# Patient Record
Sex: Male | Born: 1990 | Race: Black or African American | Hispanic: No | Marital: Single | Smoking: Current every day smoker
Health system: Southern US, Community
[De-identification: ages and names within clinical notes are randomized; demographics above are authoritative.]

## PROBLEM LIST (undated history)

## (undated) DIAGNOSIS — J45909 Unspecified asthma, uncomplicated: Secondary | ICD-10-CM

---

## 2014-06-22 ENCOUNTER — Encounter (HOSPITAL_COMMUNITY): Payer: Self-pay | Admitting: *Deleted

## 2014-06-22 ENCOUNTER — Emergency Department (HOSPITAL_COMMUNITY)
Admission: EM | Admit: 2014-06-22 | Discharge: 2014-06-22 | Disposition: A | Discharge status: Home / Self Care | Payer: Self-pay | Attending: Emergency Medicine | Admitting: Emergency Medicine

## 2014-06-22 ENCOUNTER — Emergency Department (HOSPITAL_COMMUNITY): Payer: Self-pay

## 2014-06-22 DIAGNOSIS — Z720 Tobacco use: Secondary | ICD-10-CM | POA: Insufficient documentation

## 2014-06-22 DIAGNOSIS — M542 Cervicalgia: Secondary | ICD-10-CM

## 2014-06-22 DIAGNOSIS — M549 Dorsalgia, unspecified: Secondary | ICD-10-CM

## 2014-06-22 DIAGNOSIS — S3992XD Unspecified injury of lower back, subsequent encounter: Secondary | ICD-10-CM | POA: Insufficient documentation

## 2014-06-22 DIAGNOSIS — S199XXD Unspecified injury of neck, subsequent encounter: Secondary | ICD-10-CM | POA: Insufficient documentation

## 2014-06-22 DIAGNOSIS — J45909 Unspecified asthma, uncomplicated: Secondary | ICD-10-CM | POA: Insufficient documentation

## 2014-06-22 HISTORY — DX: Unspecified asthma, uncomplicated: J45.909

## 2014-06-22 MED ORDER — METHOCARBAMOL 750 MG PO TABS: 750.0000 mg | ORAL_TABLET | Freq: Four times a day (QID) | ORAL | Status: AC | PRN
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
## 2014-06-22 NOTE — ED Notes (Signed)
Pt reports he wants to try another type of pain pill. Pt reports he has been taking naproxen with out relief. Pt was treated in [place] . Pt reports back and neck pain for 3 weeks.

## 2014-06-22 NOTE — Discharge Instructions (Signed)
Read the information below.  Use the prescribed medication as directed.  Please discuss all new medications with your pharmacist.  You may return to the Emergency Department at any time for worsening condition or any new symptoms that concern you.   If you develop fevers, loss of control of bowel or bladder, weakness or numbness in your legs, or are unable to walk, return to the ER for a recheck.     Cervical Strain and Sprain (Whiplash) with Rehab Cervical strain and sprain are injuries that commonly occur with "whiplash" injuries. Whiplash occurs when the neck is forcefully whipped backward or forward, such as during a motor vehicle accident or during contact sports. The muscles, ligaments, tendons, discs, and nerves of the neck are susceptible to injury when this occurs. RISK FACTORS Risk of having a whiplash injury increases if:  Osteoarthritis of the spine.  Situations that make head or neck accidents or trauma more likely.  High-risk sports (football, rugby, wrestling, hockey, auto racing, gymnastics, diving, contact karate, or boxing).  Poor strength and flexibility of the neck.  Previous neck injury.  Poor tackling technique.  Improperly fitted or padded equipment. SYMPTOMS   Pain or stiffness in the front or back of neck or both.  Symptoms may present immediately or up to 24 hours after injury.  Dizziness, headache, nausea, and vomiting.  Muscle spasm with soreness and stiffness in the neck.  Tenderness and swelling at the injury site. PREVENTION  Learn and use proper technique (avoid tackling with the head, spearing, and head-butting; use proper falling techniques to avoid landing on the head).  Warm up and stretch properly before activity.  Maintain physical fitness:  Strength, flexibility, and endurance.  Cardiovascular fitness.  Wear properly fitted and padded protective equipment, such as padded soft collars, for participation in contact sports. PROGNOSIS    Recovery from cervical strain and sprain injuries is dependent on the extent of the injury. These injuries are usually curable in 1 week to 3 months with appropriate treatment.  RELATED COMPLICATIONS   Temporary numbness and weakness may occur if the nerve roots are damaged, and this may persist until the nerve has completely healed.  Chronic pain due to frequent recurrence of symptoms.  Prolonged healing, especially if activity is resumed too soon (before complete recovery). TREATMENT  Treatment initially involves the use of ice and medication to help reduce pain and inflammation. It is also important to perform strengthening and stretching exercises and modify activities that worsen symptoms so the injury does not get worse. These exercises may be performed at home or with a therapist. For patients who experience severe symptoms, a soft, padded collar may be recommended to be worn around the neck.  Improving your posture may help reduce symptoms. Posture improvement includes pulling your chin and abdomen in while sitting or standing. If you are sitting, sit in a firm chair with your buttocks against the back of the chair. While sleeping, try replacing your pillow with a small towel rolled to 2 inches in diameter, or use a cervical pillow or soft cervical collar. Poor sleeping positions delay healing.  For patients with nerve root damage, which causes numbness or weakness, the use of a cervical traction apparatus may be recommended. Surgery is rarely necessary for these injuries. However, cervical strain and sprains that are present at birth (congenital) may require surgery. MEDICATION   If pain medication is necessary, nonsteroidal anti-inflammatory medications, such as aspirin and ibuprofen, or other minor pain relievers, such as acetaminophen,  are often recommended.  Do not take pain medication for 7 days before surgery.  Prescription pain relievers may be given if deemed necessary by your  caregiver. Use only as directed and only as much as you need. HEAT AND COLD:   Cold treatment (icing) relieves pain and reduces inflammation. Cold treatment should be applied for 10 to 15 minutes every 2 to 3 hours for inflammation and pain and immediately after any activity that aggravates your symptoms. Use ice packs or an ice massage.  Heat treatment may be used prior to performing the stretching and strengthening activities prescribed by your caregiver, physical therapist, or athletic trainer. Use a heat pack or a warm soak. SEEK MEDICAL CARE IF:   Symptoms get worse or do not improve in 2 weeks despite treatment.  New, unexplained symptoms develop (drugs used in treatment may produce side effects). EXERCISES RANGE OF MOTION (ROM) AND STRETCHING EXERCISES - Cervical Strain and Sprain These exercises may help you when beginning to rehabilitate your injury. In order to successfully resolve your symptoms, you must improve your posture. These exercises are designed to help reduce the forward-head and rounded-shoulder posture which contributes to this condition. Your symptoms may resolve with or without further involvement from your physician, physical therapist or athletic trainer. While completing these exercises, remember:   Restoring tissue flexibility helps normal motion to return to the joints. This allows healthier, less painful movement and activity.  An effective stretch should be held for at least 20 seconds, although you may need to begin with shorter hold times for comfort.  A stretch should never be painful. You should only feel a gentle lengthening or release in the stretched tissue. STRETCH- Axial Extensors  Lie on your back on the floor. You may bend your knees for comfort. Place a rolled-up hand towel or dish towel, about 2 inches in diameter, under the part of your head that makes contact with the floor.  Gently tuck your chin, as if trying to make a "double chin," until you  feel a gentle stretch at the base of your head.  Hold __________ seconds. Repeat __________ times. Complete this exercise __________ times per day.  STRETCH - Axial Extension   Stand or sit on a firm surface. Assume a good posture: chest up, shoulders drawn back, abdominal muscles slightly tense, knees unlocked (if standing) and feet hip width apart.  Slowly retract your chin so your head slides back and your chin slightly lowers. Continue to look straight ahead.  You should feel a gentle stretch in the back of your head. Be certain not to feel an aggressive stretch since this can cause headaches later.  Hold for __________ seconds. Repeat __________ times. Complete this exercise __________ times per day. STRETCH - Cervical Side Bend   Stand or sit on a firm surface. Assume a good posture: chest up, shoulders drawn back, abdominal muscles slightly tense, knees unlocked (if standing) and feet hip width apart.  Without letting your nose or shoulders move, slowly tip your right / left ear to your shoulder until your feel a gentle stretch in the muscles on the opposite side of your neck.  Hold __________ seconds. Repeat __________ times. Complete this exercise __________ times per day. STRETCH - Cervical Rotators   Stand or sit on a firm surface. Assume a good posture: chest up, shoulders drawn back, abdominal muscles slightly tense, knees unlocked (if standing) and feet hip width apart.  Keeping your eyes level with the ground, slowly turn your  head until you feel a gentle stretch along the back and opposite side of your neck.  Hold __________ seconds. Repeat __________ times. Complete this exercise __________ times per day. RANGE OF MOTION - Neck Circles   Stand or sit on a firm surface. Assume a good posture: chest up, shoulders drawn back, abdominal muscles slightly tense, knees unlocked (if standing) and feet hip width apart.  Gently roll your head down and around from the back of  one shoulder to the back of the other. The motion should never be forced or painful.  Repeat the motion 10-20 times, or until you feel the neck muscles relax and loosen. Repeat __________ times. Complete the exercise __________ times per day. STRENGTHENING EXERCISES - Cervical Strain and Sprain These exercises may help you when beginning to rehabilitate your injury. They may resolve your symptoms with or without further involvement from your physician, physical therapist, or athletic trainer. While completing these exercises, remember:   Muscles can gain both the endurance and the strength needed for everyday activities through controlled exercises.  Complete these exercises as instructed by your physician, physical therapist, or athletic trainer. Progress the resistance and repetitions only as guided.  You may experience muscle soreness or fatigue, but the pain or discomfort you are trying to eliminate should never worsen during these exercises. If this pain does worsen, stop and make certain you are following the directions exactly. If the pain is still present after adjustments, discontinue the exercise until you can discuss the trouble with your clinician. STRENGTH - Cervical Flexors, Isometric  Face a wall, standing about 6 inches away. Place a small pillow, a ball about 6-8 inches in diameter, or a folded towel between your forehead and the wall.  Slightly tuck your chin and gently push your forehead into the soft object. Push only with mild to moderate intensity, building up tension gradually. Keep your jaw and forehead relaxed.  Hold 10 to 20 seconds. Keep your breathing relaxed.  Release the tension slowly. Relax your neck muscles completely before you start the next repetition. Repeat __________ times. Complete this exercise __________ times per day. STRENGTH- Cervical Lateral Flexors, Isometric   Stand about 6 inches away from a wall. Place a small pillow, a ball about 6-8 inches  in diameter, or a folded towel between the side of your head and the wall.  Slightly tuck your chin and gently tilt your head into the soft object. Push only with mild to moderate intensity, building up tension gradually. Keep your jaw and forehead relaxed.  Hold 10 to 20 seconds. Keep your breathing relaxed.  Release the tension slowly. Relax your neck muscles completely before you start the next repetition. Repeat __________ times. Complete this exercise __________ times per day. STRENGTH - Cervical Extensors, Isometric   Stand about 6 inches away from a wall. Place a small pillow, a ball about 6-8 inches in diameter, or a folded towel between the back of your head and the wall.  Slightly tuck your chin and gently tilt your head back into the soft object. Push only with mild to moderate intensity, building up tension gradually. Keep your jaw and forehead relaxed.  Hold 10 to 20 seconds. Keep your breathing relaxed.  Release the tension slowly. Relax your neck muscles completely before you start the next repetition. Repeat __________ times. Complete this exercise __________ times per day. POSTURE AND BODY MECHANICS CONSIDERATIONS - Cervical Strain and Sprain Keeping correct posture when sitting, standing or completing your activities will reduce  the stress put on different body tissues, allowing injured tissues a chance to heal and limiting painful experiences. The following are general guidelines for improved posture. Your physician or physical therapist will provide you with any instructions specific to your needs. While reading these guidelines, remember:  The exercises prescribed by your provider will help you have the flexibility and strength to maintain correct postures.  The correct posture provides the optimal environment for your joints to work. All of your joints have less wear and tear when properly supported by a spine with good posture. This means you will experience a healthier,  less painful body.  Correct posture must be practiced with all of your activities, especially prolonged sitting and standing. Correct posture is as important when doing repetitive low-stress activities (typing) as it is when doing a single heavy-load activity (lifting). PROLONGED STANDING WHILE SLIGHTLY LEANING FORWARD When completing a task that requires you to lean forward while standing in one place for a long time, place either foot up on a stationary 2- to 4-inch high object to help maintain the best posture. When both feet are on the ground, the low back tends to lose its slight inward curve. If this curve flattens (or becomes too large), then the back and your other joints will experience too much stress, fatigue more quickly, and can cause pain.  RESTING POSITIONS Consider which positions are most painful for you when choosing a resting position. If you have pain with flexion-based activities (sitting, bending, stooping, squatting), choose a position that allows you to rest in a less flexed posture. You would want to avoid curling into a fetal position on your side. If your pain worsens with extension-based activities (prolonged standing, working overhead), avoid resting in an extended position such as sleeping on your stomach. Most people will find more comfort when they rest with their spine in a more neutral position, neither too rounded nor too arched. Lying on a non-sagging bed on your side with a pillow between your knees, or on your back with a pillow under your knees will often provide some relief. Keep in mind, being in any one position for a prolonged period of time, no matter how correct your posture, can still lead to stiffness. WALKING Walk with an upright posture. Your ears, shoulders, and hips should all line up. OFFICE WORK When working at a desk, create an environment that supports good, upright posture. Without extra support, muscles fatigue and lead to excessive strain on joints  and other tissues. CHAIR:  A chair should be able to slide under your desk when your back makes contact with the back of the chair. This allows you to work closely.  The chair's height should allow your eyes to be level with the upper part of your monitor and your hands to be slightly lower than your elbows.  Body position:  Your feet should make contact with the floor. If this is not possible, use a foot rest.  Keep your ears over your shoulders. This will reduce stress on your neck and low back. Document Released: 04/24/2005 Document Revised: 09/08/2013 Document Reviewed: 08/06/2008 [facility] Patient Information 2015 [place], [place]. This information is not intended to replace advice given to you by your health care provider. Make sure you discuss any questions you have with your health care provider.  Back Pain, Adult Low back pain is very common. About 1 in 5 people have back pain.The cause of low back pain is rarely dangerous. The pain often gets better  over time.About half of people with a sudden onset of back pain feel better in just 2 weeks. About 8 in 10 people feel better by 6 weeks.  CAUSES Some common causes of back pain include:  Strain of the muscles or ligaments supporting the spine.  Wear and tear (degeneration) of the spinal discs.  Arthritis.  Direct injury to the back. DIAGNOSIS Most of the time, the direct cause of low back pain is not known.However, back pain can be treated effectively even when the exact cause of the pain is unknown.Answering your caregiver's questions about your overall health and symptoms is one of the most accurate ways to make sure the cause of your pain is not dangerous. If your caregiver needs more information, he or she may order lab work or imaging tests (X-rays or MRIs).However, even if imaging tests show changes in your back, this usually does not require surgery. HOME CARE INSTRUCTIONS For many people, back pain returns.Since low  back pain is rarely dangerous, it is often a condition that people can learn to [facility] their own.   Remain active. It is stressful on the back to sit or stand in one place. Do not sit, drive, or stand in one place for more than 30 minutes at a time. Take short walks on level surfaces as soon as pain allows.Try to increase the length of time you walk each day.  Do not stay in bed.Resting more than 1 or 2 days can delay your recovery.  Do not avoid exercise or work.Your body is made to move.It is not dangerous to be active, even though your back may hurt.Your back will likely heal faster if you return to being active before your pain is gone.  Pay attention to your body when you bend and lift. Many people have less discomfortwhen lifting if they bend their knees, keep the load close to their bodies,and avoid twisting. Often, the most comfortable positions are those that put less stress on your recovering back.  Find a comfortable position to sleep. Use a firm mattress and lie on your side with your knees slightly bent. If you lie on your back, put a pillow under your knees.  Only take over-the-counter or prescription medicines as directed by your caregiver. Over-the-counter medicines to reduce pain and inflammation are often the most helpful.Your caregiver may prescribe muscle relaxant drugs.These medicines help dull your pain so you can more quickly return to your normal activities and healthy exercise.  Put ice on the injured area.  Put ice in a plastic bag.  Place a towel between your skin and the bag.  Leave the ice on for 15-20 minutes, 03-04 times a day for the first 2 to 3 days. After that, ice and heat may be alternated to reduce pain and spasms.  Ask your caregiver about trying back exercises and gentle massage. This may be of some benefit.  Avoid feeling anxious or stressed.Stress increases muscle tension and can worsen back pain.It is important to recognize when you  are anxious or stressed and learn ways to manage it.Exercise is a great option. SEEK MEDICAL CARE IF:  You have pain that is not relieved with rest or medicine.  You have pain that does not improve in 1 week.  You have new symptoms.  You are generally not feeling well. SEEK IMMEDIATE MEDICAL CARE IF:   You have pain that radiates from your back into your legs.  You develop new bowel or bladder control problems.  You have  unusual weakness or numbness in your arms or legs.  You develop nausea or vomiting.  You develop abdominal pain.  You feel faint. Document Released: 04/24/2005 Document Revised: 10/24/2011 Document Reviewed: 08/26/2013 [facility] Patient Information 2015 [place], [place]. This information is not intended to replace advice given to you by your health care provider. Make sure you discuss any questions you have with your health care provider.    Emergency Department Resource Guide 1) Find a Doctor and Pay Out of Pocket Although you won't have to find out who is covered by your insurance plan, it is a good idea to ask around and get recommendations. You will then need to call the office and see if the doctor you have chosen will accept you as a new patient and what types of options they offer for patients who are self-pay. Some doctors offer discounts or will set up payment plans for their patients who do not have insurance, but you will need to ask so you aren't surprised when you get to your appointment.  2) Contact Your Local Health Department Not all health departments have doctors that can see patients for sick visits, but many do, so it is worth a call to see if yours does. If you don't know where your local health department is, you can check in your phone book. The CDC also has a tool to help you locate your state's health department, and many state websites also have listings of all of their local health departments.  3) Find a Walk-in Clinic If your illness is  not likely to be very severe or complicated, you may want to try a walk in clinic. These are popping up all over the country in pharmacies, drugstores, and shopping centers. They're usually staffed by nurse practitioners or physician assistants that have been trained to treat common illnesses and complaints. They're usually fairly quick and inexpensive. However, if you have serious medical issues or chronic medical problems, these are probably not your best option.  No Primary Care Doctor: - Call [facility] Connect at  [phone] - they can help you locate a primary care doctor that  accepts your insurance, provides certain services, etc. - Physician Referral Service- [phone]  Chronic Pain Problems: Organization         Address  Phone   Notes  [facility]  [phone] Patients need to be referred by their primary care doctor.   Medication Assistance: Organization         Address  Phone   Notes  [facility] Medication [facility] [address] [place] [place], [place] [address] [phone] --Must be a resident of [facility] -- Must have NO insurance coverage whatsoever (no Medicaid/ Medicare, etc.) -- The pt. MUST have a primary care doctor that directs their care regularly and follows them in the community   [facility]  [phone]   [employer]  [phone]    Agencies that provide inexpensive medical care: Organization         Address  Phone   Notes  [facility]  [phone]   [facility]    [phone]   [facility] [address] [place], [place] [address] [phone]   [facility] [address] [place], [place] [phone]   [facility]    [phone]   [facility]    [phone]   [facility]  [address], [place] Phone:  [phone], Fax:  [phone] Hours of Operation:  9 am - 6  pm, M-F.  Also accepts Medicaid/Medicare and self-pay.  [facility]  [address], [place] Phone: [phone], Fax: [phone]. Hours of Operation:  8:30 am - 5:30 pm, M-F.  Also accepts Medicaid and self-pay.  [facility] [place] [address], [place] Phone: [phone]   [facility] [address], [place], [place] [phone], Ext. [phone] Mondays & Thursdays: 7-9 AM.  First 15 patients are seen on a first come, first serve basis.    Med[place]-accepting [place] Providers:  Organization         Address  Phone   Notes  [facility] [address], [place] [phone] Also accepts self-pay patients.  [facility] [address], [place]  [phone]   [facility] [address], [place] [phone]   [facility] [address] [place] [address], [place] [phone]   Renaye RakersVeita Bland [address], [place]   [phone] Only accepts [place] patients after they have their name applied to their card.   Self-Pay (no insurance) in [facility] [place]:  Organization         Address  Phone   Notes  Sickle Cell Patients, [facility] [address], [place] [phone]   [facility] [address], [place] [phone]   [facility]  [address], [place] [phone]   [facility]/Dr. Osei-Bonsu  [address] [place] [address], [place] or [phone], [place] [phone] Phone number for both [place] and [place] locations is the same.  [facility] [address], [place] [phone]   [facility]  [address], [place] or [address] [phone]   [facility] [address], [place] [phone], phone; [phone]) [phone], fax Sees patients 1st and 3rd Saturday of every month.  Must not qualify for public or private insurance (i.e. Medicaid, Medicare, [place] Health Choice, Veterans' Benefits)  Household income should be no more than 200% of the poverty level The clinic cannot treat you if you are pregnant or think you are pregnant  Sexually transmitted diseases are not treated at the clinic.    Dental Care: Organization         Address  Phone  Notes  [facility] [address], [place] [phone] Accepts children up to age 25 who are enrolled in [place] or [place] Health Choice; pregnant women with a Medicaid card; and children who have applied for Medicaid or [place] Health Choice, but were declined, whose parents can pay a reduced fee at time of service.  [facility] [place]  [address], [place] [phone] Accepts children up to age 25 who are enrolled in [place] or [place] Health Choice; pregnant women with a Medicaid card; and children who have applied for Medicaid or  Health Choice, but were declined, whose parents can pay a reduced fee at time of service.  [facility]  [address], [place] [phone]  Patients are seen by appointment only. Walk-ins are not accepted. [facility] will see patients 70 years of age and older. Monday - Tuesday (8am-5pm) Most Wednesdays (8:30-5pm) $30 per visit, cash only  [facility]  [address], [facility] [phone] Patients are seen by appointment only. Walk-ins are not accepted. [facility] will see patients 16 years of age and older. One Wednesday Evening (Monthly: Volunteer Based).  $30 per visit, cash only  [employer]  [phone] for adults; Children under age 52, call Graduate Pediatric Dentistry at [phone]. Children aged 76-14, please call [phone] to request a pediatric application.  Dental services are provided in all areas of dental care including fillings, crowns and bridges, complete and partial dentures, implants, gum treatment, root canals, and extractions. Preventive care is also provided. Treatment is provided to both adults and children. Patients are selected via a lottery and there is often a waiting list.   [facility] [address] [place]  [phone] [email or web address]   [facility] [address] [place], [place] [phone], Ext. [phone] Second and Fourth Thursday of each month, opens at 6:30 AM; Clinic ends at 9 AM.  Patients are seen on a first-come first-served basis, and a limited number are seen during each clinic.   [facility]  [address] [place], [place] [phone]   Eligibility Requirements You must have lived in [place], [place], or [place] counties for at least the last three months.   You cannot be eligible for state or federal sponsored [place], including [employer], [place], or [employer].   You generally cannot be eligible for healthcare insurance through your employer.    How to apply: Eligibility screenings are held every Tuesday and Wednesday afternoon from 1:00 pm until 4:00 pm. You do not need an appointment for the interview!  [facility] [address] [place], [place] [phone]   [facility] Health [facility]  [phone]   [facility] Health Department  [phone]   [facility] Health Department  [phone]    Behavioral Health Resources in the Community: Intensive Outpatient Programs Organization         Address  Phone  Notes  [facility] [address], [place], [place] [phone]   [facility] [address] [place], [place] [phone]   [facility]  [address], [place], [place]  [phone]   [facility] [address]  [place], [place] [phone] or [phone]   Substance Abuse Resources Organization         Address  Phone  Notes  [facility]  [phone]   [facility]  [phone]   [facility]  [phone]   Floydene Flock  [phone]   Residential & Outpatient Substance Abuse Program  [phone]   Psychological Services Organization         Address  Phone  Notes  [facility]  [phone]   [facility]  [phone]   [facility] [address] [place] [phone] or [phone]    Mobile Crisis Teams Organization         Address  Phone  Notes  [facility], Mobile Crisis Care Unit  [phone]   [facility]  [address] [place], [place] [phone]   [facility] [address], [place]  [address] [place] [phone]    Self-Help/Support Groups Organization         Address  Phone             Notes  [facility] [place] - variety of support groups  [phone]- I7437963 Call for more information  Narcotics Anonymous (NA), [facility] [address], [employer] [place]  2 meetings at this location   Statistician         Address  Phone  Notes  [facility] [address],    [place]  [phone]   [facility]  [address] [place] [address], [place], [place] [phone]   [facility] [address] [place], [place] [phone] Admissions: 8am-3pm M-F  [facility] [address],    [place], [place] [phone]   [facility] [address] [place], [place], [place] [phone]   [facility] [address],  [place], [place] [phone]   [facility] - Intensive Outpatient [address], [place], [place]  [phone]   [facility] [address]  [place], [place] [phone] or [phone]   [facility] ([facility]) [address] [place], [place] [phone] Accepts Medicaid  [facility] [address],  [place] [phone] Substance Abuse/Addiction Treatment   [facility] Organization         Address  Phone  Notes  [facility]  [phone]   Angie Fava, PhD [address] [place], [place]   [phone] or [phone]   [facility]   [address] [place], [place] [phone]   [facility] [address], [place], [place] [phone] Insurance/Medicaid/sponsorship through [facility] Families [address]                                    [place], [place] [phone] Therapy/tele-psych/case  [facility] [address], [place] [phone]    Dr. Lolly Mustache  [phone]   [facility]  [employer] [facility] Dept. 1) [address] [place] 2) [address] [place] 3)  [address], [place] [phone]  [phone]   [facility] Child Abuse Hotline [phone] or [phone] (After Hours)

## 2014-06-22 NOTE — ED Notes (Signed)
Declined W/C at D/C and was escorted to lobby by RN. 

## 2015-04-09 ENCOUNTER — Encounter (HOSPITAL_COMMUNITY): Payer: Self-pay | Admitting: Emergency Medicine

## 2015-04-09 ENCOUNTER — Emergency Department (HOSPITAL_COMMUNITY)
Admission: EM | Admit: 2015-04-09 | Discharge: 2015-04-09 | Disposition: A | Discharge status: Home / Self Care | Payer: Self-pay | Attending: Emergency Medicine | Admitting: Emergency Medicine

## 2015-04-09 DIAGNOSIS — K047 Periapical abscess without sinus: Secondary | ICD-10-CM | POA: Insufficient documentation

## 2015-04-09 DIAGNOSIS — J45909 Unspecified asthma, uncomplicated: Secondary | ICD-10-CM | POA: Insufficient documentation

## 2015-04-09 DIAGNOSIS — K029 Dental caries, unspecified: Secondary | ICD-10-CM | POA: Insufficient documentation

## 2015-04-09 DIAGNOSIS — F17210 Nicotine dependence, cigarettes, uncomplicated: Secondary | ICD-10-CM | POA: Insufficient documentation

## 2015-04-09 MED ORDER — HYDROCODONE-ACETAMINOPHEN 5-325 MG PO TABS: 1.0000 | ORAL_TABLET | ORAL | Status: AC | PRN

## 2015-04-09 MED ORDER — PENICILLIN V POTASSIUM 500 MG PO TABS: 500.0000 mg | ORAL_TABLET | Freq: Four times a day (QID) | ORAL | Status: DC

## 2015-04-09 MED ORDER — NAPROXEN 375 MG PO TABS: 375.0000 mg | ORAL_TABLET | Freq: Two times a day (BID) | ORAL | Status: AC

## 2015-04-09 NOTE — ED Notes (Signed)
Pt from home for dental pain x3 days, cavities noted with drainage to gum. Airway intact. nad noted.

## 2015-04-09 NOTE — Discharge Instructions (Signed)
Dental Abscess °A dental abscess is a collection of pus in or around a tooth. °CAUSES °This condition is caused by a bacterial infection around the root of the tooth that involves the inner part of the tooth (pulp). It may result from: °· Severe tooth decay. °· Trauma to the tooth that allows bacteria to enter into the pulp, such as a broken or chipped tooth. °· Severe gum disease around a tooth. °SYMPTOMS °Symptoms of this condition include: °· Severe pain in and around the infected tooth. °· Swelling and redness around the infected tooth, in the mouth, or in the face. °· Tenderness. °· Pus drainage. °· Bad breath. °· Bitter taste in the mouth. °· Difficulty swallowing. °· Difficulty opening the mouth. °· Nausea. °· Vomiting. °· Chills. °· Swollen neck glands. °· Fever. °DIAGNOSIS °This condition is diagnosed with examination of the infected tooth. During the exam, your dentist may tap on the infected tooth. Your dentist will also ask about your medical and dental history and may order X-rays. °TREATMENT °This condition is treated by eliminating the infection. This may be done with: °· Antibiotic medicine. °· A root canal. This may be performed to save the tooth. °· Pulling (extracting) the tooth. This may also involve draining the abscess. This is done if the tooth cannot be saved. °HOME CARE INSTRUCTIONS °· Take medicines only as directed by your dentist. °· If you were prescribed antibiotic medicine, finish all of it even if you start to feel better. °· Rinse your mouth (gargle) often with salt water to relieve pain or swelling. °· Do not drive or operate heavy machinery while taking pain medicine. °· Do not apply heat to the outside of your mouth. °· Keep all follow-up visits as directed by your dentist. This is important. °SEEK MEDICAL CARE IF: °· Your pain is worse and is not helped by medicine. °SEEK IMMEDIATE MEDICAL CARE IF: °· You have a fever or chills. °· Your symptoms suddenly get worse. °· You have a  very bad headache. °· You have problems breathing or swallowing. °· You have trouble opening your mouth. °· You have swelling in your neck or around your eye. °  °This information is not intended to replace advice given to you by your health care provider. Make sure you discuss any questions you have with your health care provider. °  °Document Released: 04/24/2005 Document Revised: 09/08/2014 Document Reviewed: 04/21/2014 °Elsevier Interactive Patient Education ©2016 Elsevier Inc. ° °[employer]  °School of Dental Medicine  °[facility]  °[address]  °[place], [address] [address]  °Phone [phone]  °The [employer] [facility] [employer] [facility] in [place], [place], exemplifies the Dental School’s vision to improve the health and quality of life of all [place] by creating leaders with a passion to care for the underserved and by leading the nation in community-based, service learning oral health education. °We are committed to offering comprehensive general dental services for adults, children and special needs patients in a safe, caring and professional setting. ° °Appointments: Our clinic is open Monday through Friday 8:00 a.m. until 5:00 p.m. The amount of time scheduled for an appointment depends on the patient’s specific needs. We ask that you keep your appointed time for care or provide 24-hour notice of all appointment changes. Parents or legal guardians must accompany minor children. ° °Payment for Services: Medicaid and other insurance plans are welcome. Payment for services is due when services are   rendered and may be made by cash or credit card. If you have dental insurance, we will assist you with your claim submission.   Emergencies: Emergency services will be provided Monday through Friday on a walk-in basis. Please arrive early for emergency services. After hours emergency services  will be provided for patients of record as required.  Services:  Medical illustratorComprehensive General Dentistry  Childrens Dentistry  Oral Surgery - Extractions  Root Canals  Sealants and Tooth Colored Fillings  Crowns and Bridges  Dentures and Partial Dentures  Implant Services  Periodontal Services and Cleanings  Cosmetic Building services engineerTooth Whitening  Digital Radiography  3-D/[facility] Beam Imaging

## 2015-04-09 NOTE — ED Provider Notes (Signed)
CSN: [id number]     Arrival date & time 04/09/15  1239 History  By signing my name below, I, Freida Busmaniana Omoyeni, attest that this documentation has been prepared under the direction and in the presence of non-physician practitioner, Arthor CaptainAbigail Ladesha Pacini, PA-C. Electronically Signed: Freida Busmaniana Omoyeni, Scribe. 04/09/2015. 1:19 PM.    Chief Complaint  Patient presents with  . Dental Pain    The history is provided by the patient. No language interpreter was used.     HPI Comments:  Jesse Hopkins is a 24 y.o. male who presents to the Emergency Department complaining of left sided gum swelling for 2-3 days with 10/10 left upper dental pain. He denies fever, and difficulty swallowing. Pt does not currently have a dentist. No alleviating factors noted.   Past Medical History  Diagnosis Date  . Asthma    History reviewed. No pertinent past surgical history. No family history on file. Social History  Substance Use Topics  . Smoking status: Current Every Day Smoker -- 0.50 packs/day    Types: Cigarettes  . Smokeless tobacco: Never Used  . Alcohol Use: 0.6 oz/week    1 Shots of liquor per week     Comment: social    Review of Systems  Constitutional: Negative for fever and chills.  HENT: Positive for dental problem. Negative for trouble swallowing.   Respiratory: Negative for shortness of breath.   Cardiovascular: Negative for chest pain.    Allergies  Fish allergy  Home Medications   Prior to Admission medications   Medication Sig Start Date End Date Taking? Authorizing Provider  methocarbamol (ROBAXIN) 750 MG tablet Take 1 tablet (750 mg total) by mouth every 6 (six) hours as needed for muscle spasms (or pain). Patient not taking: Reported on 04/09/2015 06/22/14   Trixie DredgeEmily West, PA-C   BP 134/92 mmHg  Pulse 64  Temp(Src) 98.4 F (36.9 C) (Oral)  Resp 18  Ht 6' (1.829 m)  Wt 176 lb 8 oz (80.06 kg)  BMI 23.93 kg/m2  SpO2 100% Physical Exam  Constitutional: He is oriented to person,  place, and time. He appears well-developed and well-nourished. No distress.  HENT:  Head: Normocephalic and atraumatic.  Large dental cavity  Swelling in gum and cheek surrounding tooth; draining purulence from left upper last molar    Eyes: Conjunctivae are normal.  Cardiovascular: Normal rate.   Pulmonary/Chest: Effort normal.  Abdominal: He exhibits no distension.  Neurological: He is alert and oriented to person, place, and time.  Skin: Skin is warm and dry.  Psychiatric: He has a normal mood and affect.  Nursing note and vitals reviewed.   ED Course  Procedures   DIAGNOSTIC STUDIES:  Oxygen Saturation is 100% on RA, normal by my interpretation.    COORDINATION OF CARE:  1:17 PM Discussed treatment plan with pt at bedside and pt agreed to plan.    MDM   Final diagnoses:  Dental infection    Patient with dentalgia.  No abscess requiring immediate incision and drainage.  Exam not concerning for Ludwig's angina or pharyngeal abscess.  Will treat with antibiotic. Pt instructed to follow-up with dentist.  Discussed return precautions. Pt safe for discharge.   I personally performed the services described in this documentation, which was scribed in my presence. The recorded information has been reviewed and is accurate.       Arthor Captainbigail Meridee Branum, PA-C 04/09/15 1331  Pricilla LovelessScott Goldston, MD 04/12/15 [phone]

## 2015-12-12 ENCOUNTER — Emergency Department (HOSPITAL_COMMUNITY)
Admission: EM | Admit: 2015-12-12 | Discharge: 2015-12-13 | Disposition: A | Discharge status: Home / Self Care | Payer: Self-pay | Attending: Emergency Medicine | Admitting: Emergency Medicine

## 2015-12-12 ENCOUNTER — Encounter (HOSPITAL_COMMUNITY): Payer: Self-pay | Admitting: Oncology

## 2015-12-12 DIAGNOSIS — H6121 Impacted cerumen, right ear: Secondary | ICD-10-CM | POA: Insufficient documentation

## 2015-12-12 DIAGNOSIS — F17210 Nicotine dependence, cigarettes, uncomplicated: Secondary | ICD-10-CM | POA: Insufficient documentation

## 2015-12-12 DIAGNOSIS — H9201 Otalgia, right ear: Secondary | ICD-10-CM

## 2015-12-12 DIAGNOSIS — J45909 Unspecified asthma, uncomplicated: Secondary | ICD-10-CM | POA: Insufficient documentation

## 2015-12-12 NOTE — ED Triage Notes (Signed)
Pt c/o left ear pain x 3 days.  States that sounds are muffled in that ear.  Rates the pain 8/10.

## 2015-12-13 NOTE — ED Provider Notes (Signed)
[facility]-EMERGENCY DEPT Provider Note   CSN: [id number] Arrival date & time: 12/12/15  2151  First Provider Contact:  First MD Initiated Contact with Patient 12/13/15 0016     By signing my name below, I, Vista Minkobert Ross, attest that this documentation has been prepared under the direction and in the presence of Jalesia Loudenslager PA-C.  Electronically Signed: Vista Minkobert Ross, ED Scribe. 12/13/15. 12:37 AM.   History   Chief Complaint Chief Complaint  Patient presents with  . Otalgia    HPI HPI Comments: Jesse Hopkins is a 25 y.o. male who presents to the Emergency Department complaining of gradually worsening loss of hearing; onset three days ago. Pt states that his hearing in the left ear started to become dull and muffled during the day of onset. Pt reports that his hearing became much worse yesterday and is currently unable to hear anything out of his left ear. Pt denies pain to the ear. Pt denies any symptoms in his right ear. Pt denies Hx of wax buildup.      The history is provided by the patient. No language interpreter was used.  Ear Pain Associated symptoms include hearing loss (left ear).    Past Medical History:  Diagnosis Date  . Asthma     There are no active problems to display for this patient.   History reviewed. No pertinent surgical history.     Home Medications    Prior to Admission medications   Medication Sig Start Date End Date Taking? Authorizing Provider  HYDROcodone-acetaminophen (NORCO) 5-325 MG tablet Take 1 tablet by mouth every 4 (four) hours as needed. 04/09/15   Arthor CaptainAbigail Harris, PA-C  methocarbamol (ROBAXIN) 750 MG tablet Take 1 tablet (750 mg total) by mouth every 6 (six) hours as needed for muscle spasms (or pain). Patient not taking: Reported on 04/09/2015 06/22/14   Trixie DredgeEmily West, PA-C  naproxen (NAPROSYN) 375 MG tablet Take 1 tablet (375 mg total) by mouth 2 (two) times daily. 04/09/15   Arthor CaptainAbigail Harris, PA-C  penicillin v potassium (VEETID) 500 MG tablet  Take 1 tablet (500 mg total) by mouth 4 (four) times daily. 04/09/15   Arthor CaptainAbigail Harris, PA-C    Family History No family history on file.  Social History Social History  Substance Use Topics  . Smoking status: Current Every Day Smoker    Packs/day: 0.50    Types: Cigarettes  . Smokeless tobacco: Never Used  . Alcohol use 0.6 oz/week    1 Shots of liquor per week     Comment: social     Allergies   Fish allergy   Review of Systems Review of Systems  Constitutional: Negative for fever.  HENT: Positive for hearing loss (left ear). Negative for ear pain.   All other systems reviewed and are negative.    Physical Exam Updated Vital Signs BP 123/76 (BP Location: Left Arm)   Pulse 62   Temp 98 F (36.7 C) (Oral)   Resp 19   Ht 6' (1.829 m)   Wt 170 lb (77.1 kg)   SpO2 100%   BMI 23.06 kg/m   Physical Exam  Constitutional: He is oriented to person, place, and time. He appears well-developed and well-nourished. No distress.  HENT:  Head: Normocephalic and atraumatic.  Cerumen impaction in right ear canal. No pain with movement. No pre or post ocular lymphadenopathy; no mastoid tenderness  Neck: Normal range of motion.  Pulmonary/Chest: Effort normal.  Neurological: He is alert and oriented to person, place, and time.  Skin: Skin is warm and dry. He is not diaphoretic.  Psychiatric: He has a normal mood and affect. Judgment normal.  Nursing note and vitals reviewed.    ED Treatments / Results  DIAGNOSTIC STUDIES: Oxygen Saturation is 100% on RA, normal by my interpretation.  COORDINATION OF CARE: 12:18 AM-Will remove ear wax. Discussed treatment plan with pt at bedside and pt agreed to plan.   Labs (all labs ordered are listed, but only abnormal results are displayed) Labs Reviewed - No data to display  EKG  EKG Interpretation None       Radiology No results found.  Procedures Procedures (including critical care time)  Medications Ordered in  ED Medications - No data to display   Initial Impression / Assessment and Plan / ED Course  I have reviewed the triage vital signs and the nursing notes.  Pertinent labs & imaging results that were available during my care of the patient were reviewed by me and considered in my medical decision making (see chart for details).  Clinical Course    Patient presents with muffled hearing and found to have a right cerumen impaction. Lavage performed with some relief. Patient can be safely discharged home.   Final Clinical Impressions(s) / ED Diagnoses   Final diagnoses:  None   1. Right cerumen impaction   New Prescriptions New Prescriptions   No medications on file  I personally performed the services described in this documentation, which was scribed in my presence. The recorded information has been reviewed and is accurate.     Elpidio Anis, PA-C 12/13/15 4098    Tomasita Crumble, MD 12/13/15 [phone]

## 2016-02-07 ENCOUNTER — Emergency Department (HOSPITAL_COMMUNITY)
Admission: EM | Admit: 2016-02-07 | Discharge: 2016-02-07 | Disposition: A | Discharge status: Home / Self Care | Payer: Self-pay | Attending: Emergency Medicine | Admitting: Emergency Medicine

## 2016-02-07 ENCOUNTER — Encounter (HOSPITAL_COMMUNITY): Payer: Self-pay | Admitting: Emergency Medicine

## 2016-02-07 DIAGNOSIS — J45909 Unspecified asthma, uncomplicated: Secondary | ICD-10-CM | POA: Insufficient documentation

## 2016-02-07 DIAGNOSIS — K0889 Other specified disorders of teeth and supporting structures: Secondary | ICD-10-CM | POA: Insufficient documentation

## 2016-02-07 DIAGNOSIS — F17210 Nicotine dependence, cigarettes, uncomplicated: Secondary | ICD-10-CM | POA: Insufficient documentation

## 2016-02-07 MED ORDER — OXYCODONE-ACETAMINOPHEN 5-325 MG PO TABS
1.0000 | ORAL_TABLET | Freq: Once | ORAL | Status: AC
  Administered 2016-02-07: 1 via ORAL
  Filled 2016-02-07: qty 1

## 2016-02-07 MED ORDER — IBUPROFEN 800 MG PO TABS: 800.0000 mg | ORAL_TABLET | Freq: Three times a day (TID) | ORAL | 0 refills | Status: AC

## 2016-02-07 NOTE — ED Provider Notes (Signed)
[facility] Provider Note   CSN: [id number] Arrival date & time: 02/07/16  2132     History   Chief Complaint Chief Complaint  Patient presents with  . Dental Pain    HPI Jesse Hopkins is a 25 y.o. male.  Patient is 25 yo M presenting with dental pain that has been worsening over the past week. The pain is located at the right upper molars, which he describes as throbbing. Worse with drinking cold beverages. Has not tried anything to relieve pain. Denies any fever, chills, sore throat, difficulty swallowing, change in speech, or trismus. Also denies any abscess, drainage, or foul taste in his mouth. Does not have access to primary care provider or dentist. Denies any drug use.      Past Medical History:  Diagnosis Date  . Asthma     There are no active problems to display for this patient.   History reviewed. No pertinent surgical history.     Home Medications    Prior to Admission medications   Medication Sig Start Date End Date Taking? Authorizing Provider  HYDROcodone-acetaminophen (NORCO) 5-325 MG tablet Take 1 tablet by mouth every 4 (four) hours as needed. 04/09/15   Arthor CaptainAbigail Harris, PA-C  methocarbamol (ROBAXIN) 750 MG tablet Take 1 tablet (750 mg total) by mouth every 6 (six) hours as needed for muscle spasms (or pain). Patient not taking: Reported on 04/09/2015 06/22/14   Trixie DredgeEmily West, PA-C  naproxen (NAPROSYN) 375 MG tablet Take 1 tablet (375 mg total) by mouth 2 (two) times daily. 04/09/15   Arthor CaptainAbigail Harris, PA-C  penicillin v potassium (VEETID) 500 MG tablet Take 1 tablet (500 mg total) by mouth 4 (four) times daily. 04/09/15   Arthor CaptainAbigail Harris, PA-C    Family History No family history on file.  Social History Social History  Substance Use Topics  . Smoking status: Current Every Day Smoker    Packs/day: 0.50    Types: Cigarettes  . Smokeless tobacco: Never Used  . Alcohol use 0.6 oz/week    1 Shots of liquor per week     Comment: social      Allergies   Fish allergy   Review of Systems Review of Systems  Constitutional: Negative for chills and fever.  HENT: Positive for dental problem. Negative for drooling, ear pain, facial swelling, mouth sores, sore throat, trouble swallowing and voice change.   Respiratory: Negative for cough, shortness of breath and stridor.      Physical Exam Updated Vital Signs BP 134/82 (BP Location: Left Arm)   Pulse 84   Temp 98.5 F (36.9 C) (Oral)   Resp 16   Ht 6' (1.829 m)   Wt 78.9 kg   SpO2 99%   BMI 23.60 kg/m   Physical Exam  Constitutional:  Patient is sitting in chair, appears uncomfortable, but non-toxic and handling his own secretions  HENT:  Head: Normocephalic and atraumatic.  Mouth/Throat: Uvula is midline, oropharynx is clear and moist and mucous membranes are normal. No oral lesions. No trismus in the jaw. Abnormal dentition. Dental caries present. No dental abscesses or uvula swelling. No oropharyngeal exudate, posterior oropharyngeal edema, posterior oropharyngeal erythema or tonsillar abscesses. No tonsillar exudate.  Eyes: Conjunctivae are normal.  Neck: Normal range of motion. Neck supple.  Cardiovascular: Normal rate.   Pulmonary/Chest: Effort normal. No respiratory distress.  Musculoskeletal: Normal range of motion.  Lymphadenopathy:    He has no cervical adenopathy.  Neurological: He is alert.  Skin: Skin is warm and  dry.  Nursing note and vitals reviewed.    ED Treatments / Results  Labs (all labs ordered are listed, but only abnormal results are displayed) Labs Reviewed - No data to display  EKG  EKG Interpretation None       Radiology No results found.  Procedures Procedures (including critical care time)  Medications Ordered in ED Medications - No data to display   Initial Impression / Assessment and Plan / ED Course  I have reviewed the triage vital signs and the nursing notes.  Pertinent labs & imaging results that were  available during my care of the patient were reviewed by me and considered in my medical decision making (see chart for details).  Clinical Course   Patient is 25 yo M presenting with dental pain. He is afebrile, appears non-toxic, and is handling his own secretions. He has significant dental decay, but no signs of abscess or infection. No facial or neck swelling, and no evidence of Ludwig's or deep space infection. Given one percocet in ED for pain relief, and prescription for ibuprofen. Provided patient with contact information for local dental clinics, and stressed the importance of dental follow up for ultimate management of dental pain. Discussed strict return precautions to ED including fever, trouble swallowing, change in speech, drooling, or signs of dental infection.  Final Clinical Impressions(s) / ED Diagnoses   Final diagnoses:  Pain, dental    New Prescriptions Discharge Medication List as of 02/07/2016 10:33 PM    START taking these medications   Details  ibuprofen (ADVIL,MOTRIN) 800 MG tablet Take 1 tablet (800 mg total) by mouth 3 (three) times daily., Starting Mon 02/07/2016, Print         Chrystle Murillo F de Lawrence II, [place] 02/08/16 3664    Margarita Grizzle, MD 02/10/16 [phone]

## 2016-02-07 NOTE — ED Triage Notes (Signed)
Pt. reports right upper dental pain for several months worse this week .

## 2016-02-08 ENCOUNTER — Emergency Department (HOSPITAL_COMMUNITY)
Admission: EM | Admit: 2016-02-08 | Discharge: 2016-02-08 | Disposition: A | Discharge status: Home / Self Care | Payer: Self-pay | Attending: Emergency Medicine | Admitting: Emergency Medicine

## 2016-02-08 ENCOUNTER — Encounter (HOSPITAL_COMMUNITY): Payer: Self-pay | Admitting: Emergency Medicine

## 2016-02-08 DIAGNOSIS — K0889 Other specified disorders of teeth and supporting structures: Secondary | ICD-10-CM

## 2016-02-08 DIAGNOSIS — J45909 Unspecified asthma, uncomplicated: Secondary | ICD-10-CM | POA: Insufficient documentation

## 2016-02-08 DIAGNOSIS — K029 Dental caries, unspecified: Secondary | ICD-10-CM | POA: Insufficient documentation

## 2016-02-08 DIAGNOSIS — F17210 Nicotine dependence, cigarettes, uncomplicated: Secondary | ICD-10-CM | POA: Insufficient documentation

## 2016-02-08 MED ORDER — PENICILLIN V POTASSIUM 500 MG PO TABS: 500.0000 mg | ORAL_TABLET | Freq: Four times a day (QID) | ORAL | 0 refills | Status: AC

## 2016-02-08 MED ORDER — BENZOCAINE 10 % MT GEL: 1.0000 "application " | Freq: Four times a day (QID) | OROMUCOSAL | 0 refills | Status: AC | PRN

## 2016-02-08 MED ORDER — HYDROCODONE-ACETAMINOPHEN 5-325 MG PO TABS
1.0000 | ORAL_TABLET | Freq: Once | ORAL | Status: AC
  Administered 2016-02-08: 1 via ORAL
  Filled 2016-02-08: qty 1

## 2016-02-08 MED ORDER — PENICILLIN V POTASSIUM 250 MG PO TABS
500.0000 mg | ORAL_TABLET | Freq: Once | ORAL | Status: AC
  Administered 2016-02-08: 500 mg via ORAL
  Filled 2016-02-08: qty 2

## 2016-02-08 NOTE — ED Provider Notes (Signed)
[facility] Provider Note   CSN: [id number] Arrival date & time: 02/08/16  1147  By signing my name below, I, Christy SartoriusAnastasia Kolousek, attest that this documentation has been prepared under the direction and in the presence of  Arvilla MeresAshley Meyer, PA-C. Electronically Signed: Christy SartoriusAnastasia Kolousek, ED Scribe. 02/08/16. 12:59 PM.   History   Chief Complaint Chief Complaint  Patient presents with  . Dental Pain   The history is provided by the patient and medical records. No language interpreter was used.    HPI Comments:  Jesse Hopkins is a [age] y.o. male who presents to the Emergency Department complaining of gradually worsenning right upper molar dental pain which began a little over a week ago.   Pt states he began having right sided facial swelling this morning and reports he was chewing on ice around the time his pain started.  His pain is worse when eating and he describes it as sharp, dull and throbbing with radiation on the right side of his face.  Pt was seen in the ED yesterday for the same pain and prescribed 800 mg ibuprofen.  He has been taking ibuprofen and applying a warm compress to the site with some relief.  He denies injury to the site and discharge from the area, trouble breathing, trouble swallowing, neck pain, fever, changes to his vision, and light sensitivity.   Past Medical History:  Diagnosis Date  . Asthma     There are no active problems to display for this patient.   History reviewed. No pertinent surgical history.     Home Medications    Prior to Admission medications   Medication Sig Start Date End Date Taking? Authorizing Provider  benzocaine (ORAJEL) 10 % mucosal gel Use as directed 1 application in the mouth or throat 4 (four) times daily as needed for mouth pain. Apply to affected tooth 02/08/16   Lona KettleAshley Laurel Meyer, PA-C  HYDROcodone-acetaminophen [facility](NORCO) 5-325 MG tablet Take 1 tablet by mouth every 4 (four) hours as needed. 04/09/15   Arthor CaptainAbigail Harris,  PA-C  ibuprofen (ADVIL,MOTRIN) 800 MG tablet Take 1 tablet (800 mg total) by mouth 3 (three) times daily. 02/07/16   Daryl F de Villier II, PA  methocarbamol (ROBAXIN) 750 MG tablet Take 1 tablet (750 mg total) by mouth every 6 (six) hours as needed for muscle spasms (or pain). Patient not taking: Reported on 04/09/2015 06/22/14   Trixie DredgeEmily West, PA-C  naproxen (NAPROSYN) 375 MG tablet Take 1 tablet (375 mg total) by mouth 2 (two) times daily. 04/09/15   Arthor CaptainAbigail Harris, PA-C  penicillin v potassium (VEETID) 500 MG tablet Take 1 tablet (500 mg total) by mouth 4 (four) times daily. 02/08/16   Lona KettleAshley Laurel Meyer, PA-C    Family History History reviewed. No pertinent family history.  Social History Social History  Substance Use Topics  . Smoking status: Current Every Day Smoker    Packs/day: 0.50    Types: Cigarettes  . Smokeless tobacco: Never Used  . Alcohol use 0.6 oz/week    1 Shots of liquor per week     Comment: social     Allergies   Fish allergy   Review of Systems Review of Systems  Constitutional: Negative for fever.  HENT: Positive for dental problem and facial swelling. Negative for trouble swallowing.   Eyes: Negative for photophobia and visual disturbance.  Respiratory: Negative for shortness of breath.   Musculoskeletal: Negative for neck pain.  Neurological: Positive for headaches.     Physical Exam  Updated Vital Signs BP 126/80 (BP Location: Left Arm)   Pulse 60   Temp 99.1 F (37.3 C) (Oral)   Resp 20   Ht 6' (1.829 m)   Wt 79.4 kg   SpO2 100%   BMI 23.73 kg/m   Physical Exam  Constitutional: He appears well-developed and well-nourished. No distress.  HENT:  Head: Normocephalic and atraumatic.  Mouth/Throat: Uvula is midline, oropharynx is clear and moist and mucous membranes are normal. No trismus in the jaw. Dental caries present. No posterior oropharyngeal edema or posterior oropharyngeal erythema.  No trismus. Dental caries throughout. TTP of tooth  #2 and #3. No appreciable facial swelling. No obvious abscess.  No posterior oropharynx swelling. Managing oral secretions.   Eyes: Conjunctivae are normal. No scleral icterus.  Neck: Normal range of motion. Neck supple. No neck rigidity.  No nuchal rigidity.   Cardiovascular: Normal rate.   Pulmonary/Chest: Effort normal. No respiratory distress.  Lymphadenopathy:    He has no cervical adenopathy.  Neurological: He is alert. He is not disoriented. GCS eye subscore is 4. GCS verbal subscore is 5. GCS motor subscore is 6.  Mental Status:  Alert, thought content appropriate, able to give a coherent history. Speech fluent without evidence of aphasia. Able to follow 2 step commands without difficulty.  Cranial Nerves:  II: pupils equal, round, reactive to light III,IV, VI: ptosis not present, extra-ocular motions intact bilaterally  V,VII: smile symmetric, facial light touch sensation equal VIII: hearing grossly normal to voice  IX, X: uvula elevates symmetrically  XI: bilateral shoulder shrug symmetric and strong XII: midline tongue extension without fassiculations  Skin: Skin is warm and dry. He is not diaphoretic.  Psychiatric: He has a normal mood and affect. His behavior is normal.  Nursing note and vitals reviewed.    ED Treatments / Results   DIAGNOSTIC STUDIES:  Oxygen Saturation is 100% on RA, NML by my interpretation.    COORDINATION OF CARE:  12:59 PM Will start on antibiotic and localized pain relief.  Discussed treatment plan with pt at bedside and pt agreed to plan.  Labs (all labs ordered are listed, but only abnormal results are displayed) Labs Reviewed - No data to display  EKG  EKG Interpretation None       Radiology No results found.  Procedures Procedures (including critical care time)  Medications Ordered in ED Medications  penicillin v potassium (VEETID) tablet 500 mg (500 mg Oral Given 02/08/16 1320)  HYDROcodone-acetaminophen (NORCO/VICODIN)  5-325 MG per tablet 1 tablet (1 tablet Oral Given 02/08/16 1320)     Initial Impression / Assessment and Plan / ED Course  I have reviewed the triage vital signs and the nursing notes.  Pertinent labs & imaging results that were available during my care of the patient were reviewed by me and considered in my medical decision making (see chart for details).  Clinical Course    Patient presents to ED with complaint of dental pain. Patient is afebrile and non-toxic appearing in NAD. VSS.  Patient with dentalgia at #2 and #3.  No abscess requiring immediate incision and drainage.  Exam not concerning for Ludwig's angina or pharyngeal abscess.  No trismus. No oropharyngeal edema. No nuchal rigidity. managing oral secretions. Will treat with PCN. Symptomatic management discussed to include ice and tylenol/motrin for pain. Rx oragel. Pt instructed to follow-up with dentist and resources provided.  Discussed return precautions. Pt voiced understanding and is agreeable. Pt safe for discharge.   Final Clinical Impressions(s) /  ED Diagnoses   Final diagnoses:  Pain, dental    New Prescriptions Discharge Medication List as of 02/08/2016  1:07 PM    START taking these medications   Details  benzocaine (ORAJEL) 10 % mucosal gel Use as directed 1 application in the mouth or throat 4 (four) times daily as needed for mouth pain. Apply to affected tooth, Starting Tue 02/08/2016, Print       I personally performed the services described in this documentation, which was scribed in my presence. The recorded information has been reviewed and is accurate.     Lona Kettle, PA-C 02/08/16 1325    Gwyneth Sprout, MD 02/08/16 2123

## 2016-02-08 NOTE — Discharge Instructions (Signed)
Read the information below.  It is very important that you call the dentist today for further management. I have provided the contact information above.  You are being prescribed an antibiotic. Please take as directed.  I have also prescribed oragel that is a numbing solution you can apply to affected area for symptomatic relief.  Apply ice to affected area for 20 minute increments. Take tylenol and/or motrin for pain relief.  Use the prescribed medication as directed.  Please discuss all new medications with your pharmacist.   I have provided the contact information for [facility] for establishing a primary doctor.  You may return to the Emergency Department at any time for worsening condition or any new symptoms that concern you. Return if you develop fever, trouble swallowing, trouble breathing, changes in vision, or neck stiffness.

## 2016-02-08 NOTE — ED Notes (Signed)
[facility] 62 COUPON GIVEN

## 2016-02-08 NOTE — ED Notes (Signed)
Pt woke today with right facial swelling and increased dental pain.

## 2016-02-08 NOTE — ED Triage Notes (Signed)
States he was seen here yesterday and needs stronger meds and antibiotics. Mild swelling to cheek area. Denies fevers

## 2016-02-08 NOTE — Discharge Instructions (Signed)
RESOURCE GUIDE ° °Chronic Pain Problems: °Contact [facility]  [phone] °Patients need to be referred by their primary care doctor. ° °Insufficient Money for Medicine: °Contact [employer]:  call [phone] 892-1162 ° °No Primary Care Doctor: °Call [facility]  [phone] - can help you locate a primary care doctor that  accepts your insurance, provides certain services, etc. °Physician Referral Service- [phone] ° °Agencies that provide inexpensive medical care: °[facility]  [phone] °[facility]  [phone] °[facility]  [phone] °[facility]  [phone] °[facility]  [phone] °[facility]  [phone] ° °Medicaid-accepting [place] Providers: °[facility]- [address] ° [phone], Mon-Fri 9am-7pm, Sat 9am-1pm °[facility]- [address], [address] ° [phone] °[facility]- [address], [address] ° [phone] °[facility]- [address] [place] [address] ° [phone] °[facility]- [address], [address], [phone] ° Only accepts [place] Access Medicaid patients after they have their name  applied to their card ° °Self Pay (no insurance) in [place]: °Sickle Cell Patients - [facility] ° [address], [phone] °[facility]- [address] ° [phone] °      -     [facility] - [address] °      -     [facility]- see information above (Speak to Pam H if you do not have insurance) °      -  [facility] [place]- [address],  [phone] °      -  [facility]- [address] [place] [address], [phone] °      -  Dr Osei-Bonsu-  [address], [address], [place], [phone] °      -  [facility] - [address], [phone] °      -  [facility]- [address] [place] [address], [phone], also [address] °  [address],  [phone] °      -     [facility]- [address], [phone], 1st & 3rd Saturday °        every month, 10am-1pm ° -     [facility] °  [address], [place]. °  Phone:  [phone], Fax:  [phone]. Hours of Operation:  9 am - 6 pm, M-F. ° -     [facility] °  [address], [address], [place] °  Phone: [phone], Fax: [phone]. Hours of Operation:  8:30 am - 5:30 pm, M-F. ° ° ° °Dental Assistance °If unable to pay or uninsured, contact:  [facility] to become qualified for the adult dental clinic. ° °Patients with Medicaid: [facility] °[address], [phone] °[address], [phone] ° °If unable to pay, or uninsured, contact [place] [facility] ([phone] in [place], [phone] in [place]) to become qualified for the adult dental clinic ° °[facility] °[address] °[place], [place] [address] °[phone] °[email or web address] ° °Other Low-Cost Community Dental Services: °[facility]- [address], [place], [place], [address], [phone], Ext. [address], 2nd and 4th Thursday of the month at 6:30am.  10 clients each day by appointment, can sometimes see walk-in patients if someone does not show for an appointment. °  [facility]- [address], [place], [place], [address], [phone] °[facility]- [address], [place], [place], [address], [phone] °[facility]- [phone] °[facility]- [phone] °[facility]- [phone]  °

## 2016-09-13 ENCOUNTER — Emergency Department (HOSPITAL_COMMUNITY): Payer: Self-pay

## 2016-09-13 ENCOUNTER — Encounter (HOSPITAL_COMMUNITY): Payer: Self-pay | Admitting: Obstetrics and Gynecology

## 2016-09-13 ENCOUNTER — Emergency Department (HOSPITAL_COMMUNITY)
Admission: EM | Admit: 2016-09-13 | Discharge: 2016-09-13 | Disposition: A | Discharge status: Home / Self Care | Payer: Self-pay | Attending: Emergency Medicine | Admitting: Emergency Medicine

## 2016-09-13 DIAGNOSIS — R222 Localized swelling, mass and lump, trunk: Secondary | ICD-10-CM | POA: Insufficient documentation

## 2016-09-13 DIAGNOSIS — Z79899 Other long term (current) drug therapy: Secondary | ICD-10-CM | POA: Insufficient documentation

## 2016-09-13 DIAGNOSIS — F17210 Nicotine dependence, cigarettes, uncomplicated: Secondary | ICD-10-CM | POA: Insufficient documentation

## 2016-09-13 DIAGNOSIS — J45909 Unspecified asthma, uncomplicated: Secondary | ICD-10-CM | POA: Insufficient documentation

## 2016-09-13 NOTE — Discharge Instructions (Signed)
You may take Tylenol or ibuprofen as prescribed over-the-counter as needed for pain relief. I recommend following up with the primary care provider's office below within the next week for follow-up evaluation and further imaging (US) may be warranted for your suspected cyst on your chest wall. Please return to the Emergency Department if symptoms worsen or new onset of fever, redness, swelling, tenderness, chest pain, difficulty breathing, vomiting.

## 2016-09-13 NOTE — ED Provider Notes (Signed)
[facility]-EMERGENCY DEPT Provider Note   CSN: [id number] Arrival date & time: 09/13/16  2003  By signing my name below, I, Jesse Hopkins, attest that this documentation has been prepared under the direction and in the presence of non-physician practitioner, Melburn HakeNicole Micah Galeno. Electronically Signed: Modena JanskyAlbert Hopkins, Scribe. 09/13/2016. 9:17 PM.  History   Chief Complaint Chief Complaint  Patient presents with  . Abscess   The history is provided by the patient. No language interpreter was used.   HPI Comments: Jesse Hopkins is a 26 y.o. male who presents to the Emergency Department complaining of constant moderate chest bump that started today. He states he noticed a bump around his chest area while laying down in bed. No modifying factors. Denies any prior hx of similar complaint, fever, chills, generalized myalgias, pain/swelling to bump, nausea, vomiting, nipple pain/discharge, SOB, numbness, additional rash, or other complaints at this time. Denies taking any meds PTA.   Past Medical History:  Diagnosis Date  . Asthma     There are no active problems to display for this patient.   History reviewed. No pertinent surgical history.     Home Medications    Prior to Admission medications   Medication Sig Start Date End Date Taking? Authorizing Provider  benzocaine (ORAJEL) 10 % mucosal gel Use as directed 1 application in the mouth or throat 4 (four) times daily as needed for mouth pain. Apply to affected tooth 02/08/16   Deborha PaymentMeyer, Ashley L, PA-C  HYDROcodone-acetaminophen (NORCO) 5-325 MG tablet Take 1 tablet by mouth every 4 (four) hours as needed. 04/09/15   Arthor CaptainHarris, Abigail, PA-C  ibuprofen (ADVIL,MOTRIN) 800 MG tablet Take 1 tablet (800 mg total) by mouth 3 (three) times daily. 02/07/16   de Villier, Daryl F II, PA  methocarbamol (ROBAXIN) 750 MG tablet Take 1 tablet (750 mg total) by mouth every 6 (six) hours as needed for muscle spasms (or pain). Patient not taking: Reported on 04/09/2015  06/22/14   Trixie DredgeWest, Emily, PA-C  naproxen (NAPROSYN) 375 MG tablet Take 1 tablet (375 mg total) by mouth 2 (two) times daily. 04/09/15   Arthor CaptainHarris, Abigail, PA-C  penicillin v potassium (VEETID) 500 MG tablet Take 1 tablet (500 mg total) by mouth 4 (four) times daily. 02/08/16   Deborha PaymentMeyer, Ashley L, PA-C    Family History Family History  Problem Relation Age of Onset  . Diabetes Mother   . Asthma Mother   . Hypertension Mother     Social History Social History  Substance Use Topics  . Smoking status: Current Every Day Smoker    Packs/day: 0.50    Years: 5.00    Types: Cigarettes, Cigars  . Smokeless tobacco: Never Used  . Alcohol use 0.6 oz/week    1 Shots of liquor per week     Comment: social     Allergies   Fish allergy   Review of Systems Review of Systems  Constitutional: Negative for chills and fever.  Respiratory: Negative for shortness of breath.   Musculoskeletal: Negative for myalgias.  Skin:       +bump (chest area)  Neurological: Negative for numbness.     Physical Exam Updated Vital Signs BP 111/72 (BP Location: Left Arm)   Pulse 70   Temp 98.9 F (37.2 C) (Oral)   Resp 14   Ht 5\' 11"  (1.803 m)   Wt 166 lb (75.3 kg)   SpO2 99%   BMI 23.15 kg/m   Physical Exam  Constitutional: He appears well-developed and well-nourished. No distress.  HENT:  Head: Normocephalic and atraumatic.  Eyes: Conjunctivae are normal.  Neck: Normal range of motion. Neck supple.  Cardiovascular: Normal rate, regular rhythm, normal heart sounds and intact distal pulses.   Pulmonary/Chest: Effort normal. No respiratory distress. He has no wheezes. He has no rales. He exhibits mass. He exhibits no tenderness, no laceration, no crepitus, no edema, no deformity, no swelling and no retraction. Right breast exhibits no inverted nipple, no mass, no nipple discharge, no skin change and no tenderness. Left breast exhibits no inverted nipple, no mass, no nipple discharge, no skin change and no  tenderness. Breasts are symmetrical.    Small 1x1cm mobile cystic mass palpated over the left anterior rib 5 and 6 near sternum. No surrounding swelling, erythema, or warmth. Non-tender.   Abdominal: Soft. There is no tenderness.  Musculoskeletal: Normal range of motion.  Neurological: He is alert.  Skin: Skin is warm and dry.  Psychiatric: He has a normal mood and affect.  Nursing note and vitals reviewed.    ED Treatments / Results  DIAGNOSTIC STUDIES: Oxygen Saturation is 99% on RA, normal by my interpretation.    COORDINATION OF CARE: 9:21 PM- Pt advised of plan for treatment and pt agrees.  Labs (all labs ordered are listed, but only abnormal results are displayed) Labs Reviewed - No data to display  EKG  EKG Interpretation None       Radiology Dg Ribs Unilateral W/chest Left  Result Date: 09/13/2016 CLINICAL DATA:  Small bowel or mass felt over the left mid anterior rib. This started today. Smoker. A marker is placed over the palpable abnormality. EXAM: LEFT RIBS AND CHEST - 3+ VIEW COMPARISON:  None. FINDINGS: Normal heart size and pulmonary vascularity. No focal airspace disease or consolidation in the lungs. No blunting of costophrenic angles. No pneumothorax. Mediastinal contours appear intact. The left ribs appear intact. No acute fracture or displacement identified. No focal bone lesion, bone destruction, or expansile change. No radiographic abnormalities identified to account for the palpable lesion. Further management should be based on the physical examination and clinical presentation. IMPRESSION: No evidence of active pulmonary disease. Negative left ribs. No radiographic abnormality identified to correspond to palpable lesion. Electronically Signed   By: Burman Nieves M.D.   On: 09/13/2016 21:59    Procedures Procedures (including critical care time)  Medications Ordered in ED Medications - No data to display   Initial Impression / Assessment and Plan /  ED Course  I have reviewed the triage vital signs and the nursing notes.  Pertinent labs & imaging results that were available during my care of the patient were reviewed by me and considered in my medical decision making (see chart for details).     Patient presents with small mass present to left anterior chest wall that he noticed earlier today while he was laying on his stomach. Reports mild discomfort with palpation. Denies fever, redness, swelling, drainage. Denies chest pain or shortness of breath. VSS. Exam revealed small cystic mass present to left anterior chest wall over ribs 5/6, nontender. No surrounding erythema, warmth or drainage; no breast swelling/tenderness or nipple discharge. Chest x-ray negative. Suspect patient's lesion is likely due to a benign chest wall cyst. No signs of infection or abscess. Plan to discharge patient home with symptomatic treatment advised to follow up with PCP for follow-up evaluation. Discussed return precautions.  Final Clinical Impressions(s) / ED Diagnoses   Final diagnoses:  Mass of chest wall, left    New Prescriptions  New Prescriptions   No medications on file   I personally performed the services described in this documentation, which was scribed in my presence. The recorded information has been reviewed and is accurate.     Barrett Henle, PA-C 09/13/16 2238    Lorre Nick, MD 09/14/16 [phone]

## 2016-09-13 NOTE — ED Triage Notes (Signed)
Pt states he laid down in bed and felt what seemed like a large bump up under his skin. Pt states it hurts when you press on it. No obvious bump upon inspection.  Pt states he has had some shortness of breath at times in the past few days and some nausea but no vomiting or diarrhea.

## 2016-11-30 ENCOUNTER — Emergency Department (HOSPITAL_COMMUNITY)
Admission: EM | Admit: 2016-11-30 | Discharge: 2016-12-01 | Disposition: A | Discharge status: Home / Self Care | Payer: Self-pay | Attending: Emergency Medicine | Admitting: Emergency Medicine

## 2016-11-30 ENCOUNTER — Encounter (HOSPITAL_COMMUNITY): Payer: Self-pay

## 2016-11-30 DIAGNOSIS — F17290 Nicotine dependence, other tobacco product, uncomplicated: Secondary | ICD-10-CM | POA: Insufficient documentation

## 2016-11-30 DIAGNOSIS — J45909 Unspecified asthma, uncomplicated: Secondary | ICD-10-CM | POA: Insufficient documentation

## 2016-11-30 DIAGNOSIS — Z720 Tobacco use: Secondary | ICD-10-CM

## 2016-11-30 DIAGNOSIS — J029 Acute pharyngitis, unspecified: Secondary | ICD-10-CM | POA: Insufficient documentation

## 2016-11-30 DIAGNOSIS — F17210 Nicotine dependence, cigarettes, uncomplicated: Secondary | ICD-10-CM | POA: Insufficient documentation

## 2016-11-30 LAB — RAPID STREP SCREEN (MED CTR MEBANE ONLY): Streptococcus, Group A Screen (Direct): NEGATIVE

## 2016-11-30 NOTE — ED Triage Notes (Signed)
Pt states that his throat started hurting on Tuesday. Denies fevers.

## 2016-11-30 NOTE — ED Provider Notes (Signed)
[facility]-EMERGENCY DEPT Provider Note   CSN: [id number] Arrival date & time: 11/30/16  2203     History   Chief Complaint Chief Complaint  Patient presents with  . Sore Throat    HPI Jesse Hopkins is a 26 y.o. male with a PMHx of asthma, who presents to the ED with complaints of mild constant sore throat 2 days. States his symptoms worsen with swallowing, and with no treatments tried prior to arrival. Positive sick contacts at home, states his girlfriend has similar complaints. He was concerned that he perhaps had strep so he came for evaluation. He admits to being a cigarette smoker. He denies any fevers, chills, rhinorrhea, ear pain or drainage, cough, drooling, trismus, CP, SOB, abd pain, N/V/D/C, hematuria, dysuria, myalgias, arthralgias, numbness, tingling, focal weakness, or any other complaints at this time.    The history is provided by the patient and medical records. No language interpreter was used.  Sore Throat  This is a new problem. The current episode started 2 days ago. The problem occurs constantly. The problem has not changed since onset.Pertinent negatives include no chest pain, no abdominal pain and no shortness of breath. The symptoms are aggravated by swallowing. Nothing relieves the symptoms. He has tried nothing for the symptoms. The treatment provided no relief.    Past Medical History:  Diagnosis Date  . Asthma     There are no active problems to display for this patient.   History reviewed. No pertinent surgical history.     Home Medications    Prior to Admission medications   Medication Sig Start Date End Date Taking? Authorizing Provider  benzocaine (ORAJEL) 10 % mucosal gel Use as directed 1 application in the mouth or throat 4 (four) times daily as needed for mouth pain. Apply to affected tooth 02/08/16   Deborha PaymentMeyer, Ashley L, PA-C  HYDROcodone-acetaminophen (NORCO) 5-325 MG tablet Take 1 tablet by mouth every 4 (four) hours as needed. 04/09/15   Arthor CaptainHarris,  Abigail, PA-C  ibuprofen (ADVIL,MOTRIN) 800 MG tablet Take 1 tablet (800 mg total) by mouth 3 (three) times daily. 02/07/16   de Villier, Daryl F II, PA  methocarbamol (ROBAXIN) 750 MG tablet Take 1 tablet (750 mg total) by mouth every 6 (six) hours as needed for muscle spasms (or pain). Patient not taking: Reported on 04/09/2015 06/22/14   Trixie DredgeWest, Emily, PA-C  naproxen (NAPROSYN) 375 MG tablet Take 1 tablet (375 mg total) by mouth 2 (two) times daily. 04/09/15   Arthor CaptainHarris, Abigail, PA-C  penicillin v potassium (VEETID) 500 MG tablet Take 1 tablet (500 mg total) by mouth 4 (four) times daily. 02/08/16   Deborha PaymentMeyer, Ashley L, PA-C    Family History Family History  Problem Relation Age of Onset  . Diabetes Mother   . Asthma Mother   . Hypertension Mother     Social History Social History  Substance Use Topics  . Smoking status: Current Every Day Smoker    Packs/day: 0.50    Years: 5.00    Types: Cigarettes, Cigars  . Smokeless tobacco: Never Used  . Alcohol use 0.6 oz/week    1 Shots of liquor per week     Comment: social     Allergies   Fish allergy   Review of Systems Review of Systems  Constitutional: Negative for chills and fever.  HENT: Positive for sore throat. Negative for drooling, ear discharge, ear pain, rhinorrhea and trouble swallowing.   Respiratory: Negative for cough and shortness of breath.   Cardiovascular:  Negative for chest pain.  Gastrointestinal: Negative for abdominal pain, constipation, diarrhea, nausea and vomiting.  Genitourinary: Negative for dysuria and hematuria.  Musculoskeletal: Negative for arthralgias and myalgias.  Skin: Negative for color change.  Allergic/Immunologic: Negative for immunocompromised state.  Neurological: Negative for weakness and numbness.  Psychiatric/Behavioral: Negative for confusion.   All other systems reviewed and are negative for acute change except as noted in the HPI.    Physical Exam Updated Vital Signs BP 122/63   Pulse  73   Temp 97.7 F (36.5 C) (Oral)   Resp 20   SpO2 97%   Physical Exam  Constitutional: He is oriented to person, place, and time. Vital signs are normal. He appears well-developed and well-nourished.  Non-toxic appearance. No distress.  Afebrile, nontoxic, NAD  HENT:  Head: Normocephalic and atraumatic.  Nose: Nose normal.  Mouth/Throat: Uvula is midline and mucous membranes are normal. No trismus in the jaw. No uvula swelling. Posterior oropharyngeal erythema present. No oropharyngeal exudate, posterior oropharyngeal edema or tonsillar abscesses. Tonsils are 1+ on the right. Tonsils are 1+ on the left. No tonsillar exudate.  Nose clear. Oropharynx mildly injected, without uvular swelling or deviation, no trismus or drooling, with 1+ bilateral tonsillar swelling and erythema, no exudates, few tonsillolith's noted; no PTA.    Eyes: Conjunctivae and EOM are normal. Right eye exhibits no discharge. Left eye exhibits no discharge.  Neck: Normal range of motion. Neck supple.  Cardiovascular: Normal rate, regular rhythm, normal heart sounds and intact distal pulses.  Exam reveals no gallop and no friction rub.   No murmur heard. Pulmonary/Chest: Effort normal and breath sounds normal. No respiratory distress. He has no decreased breath sounds. He has no wheezes. He has no rhonchi. He has no rales.  Abdominal: Soft. Normal appearance and bowel sounds are normal. He exhibits no distension. There is no tenderness. There is no rigidity, no rebound, no guarding, no CVA tenderness, no tenderness at McBurney's point and negative Murphy's sign.  Musculoskeletal: Normal range of motion.  Neurological: He is alert and oriented to person, place, and time. He has normal strength. No sensory deficit.  Skin: Skin is warm, dry and intact. No rash noted.  Psychiatric: He has a normal mood and affect.  Nursing note and vitals reviewed.    ED Treatments / Results  Labs (all labs ordered are listed, but only  abnormal results are displayed) Labs Reviewed  RAPID STREP SCREEN (NOT AT [facility])  CULTURE, GROUP A STREP [facility])    EKG  EKG Interpretation None       Radiology No results found.  Procedures Procedures (including critical care time)  Medications Ordered in ED Medications - No data to display   Initial Impression / Assessment and Plan / ED Course  I have reviewed the triage vital signs and the nursing notes.  Pertinent labs & imaging results that were available during my care of the patient were reviewed by me and considered in my medical decision making (see chart for details).     26 y.o. male here with sore throat 2 days, positive sick contacts at home. On exam, mildly erythematous throat, 1+ b/l tonsillar swelling with several tonsilliths but no exudates, no PTA. Pt is afebrile with a clear lung exam. Clinically unlikely to be strep, but RST done in triage, which is negative; doubt need for empiric tx. Likely viral pharyngitis. Pt is agreeable to symptomatic treatment with close follow up with PCP as needed but spoke at length about emergent  changing or worsening of symptoms that should prompt return to ER. Tobacco cessation also strongly encouraged. Pt voices understanding and is agreeable to plan. Stable at time of discharge.    Final Clinical Impressions(s) / ED Diagnoses   Final diagnoses:  Viral pharyngitis  Sore throat  Tobacco user    New Prescriptions New Prescriptions   No medications on [address] [place], [place] 12/01/16 0038    Gilda Crease, MD 12/01/16 [phone]

## 2016-12-01 NOTE — ED Notes (Signed)
Patient Alert and oriented X4. Stable and ambulatory. Patient verbalized understanding of the discharge instructions.  Patient belongings were taken by the patient.  

## 2016-12-01 NOTE — ED Notes (Signed)
Patient ambulatory to the bed.  Patient is A&Ox4 at this time.  Patient in no signs of distress.  Please see providers note for complete history and physical exam.

## 2016-12-01 NOTE — Discharge Instructions (Signed)
Continue to stay well-hydrated. Gargle warm salt water and spit it out. Use chloraseptic spray as needed for sore throat. Continue to alternate between Tylenol and Ibuprofen for pain or fever. Use Mucinex for cough suppression/expectoration of mucus. Use netipot and flonase to help with nasal congestion. May consider over-the-counter Benadryl or other antihistamine to decrease secretions and for help with your symptoms. STOP SMOKING! Follow up with your primary care doctor in 5-7 days for recheck of ongoing symptoms. Return to emergency department for emergent changing or worsening of symptoms. °

## 2016-12-02 LAB — CULTURE, GROUP A STREP (THRC)

## 2018-08-23 IMAGING — CR DG RIBS W/ CHEST 3+V*L*
3 series · 3 of 3 positions shown · non-contrast
Comparison: None.

CLINICAL DATA: Small bowel or mass felt over the left mid anterior
rib. This started today. Smoker. A marker is placed over the
palpable abnormality.

EXAM:
LEFT RIBS AND CHEST - 3+ VIEW

[w chest pa]
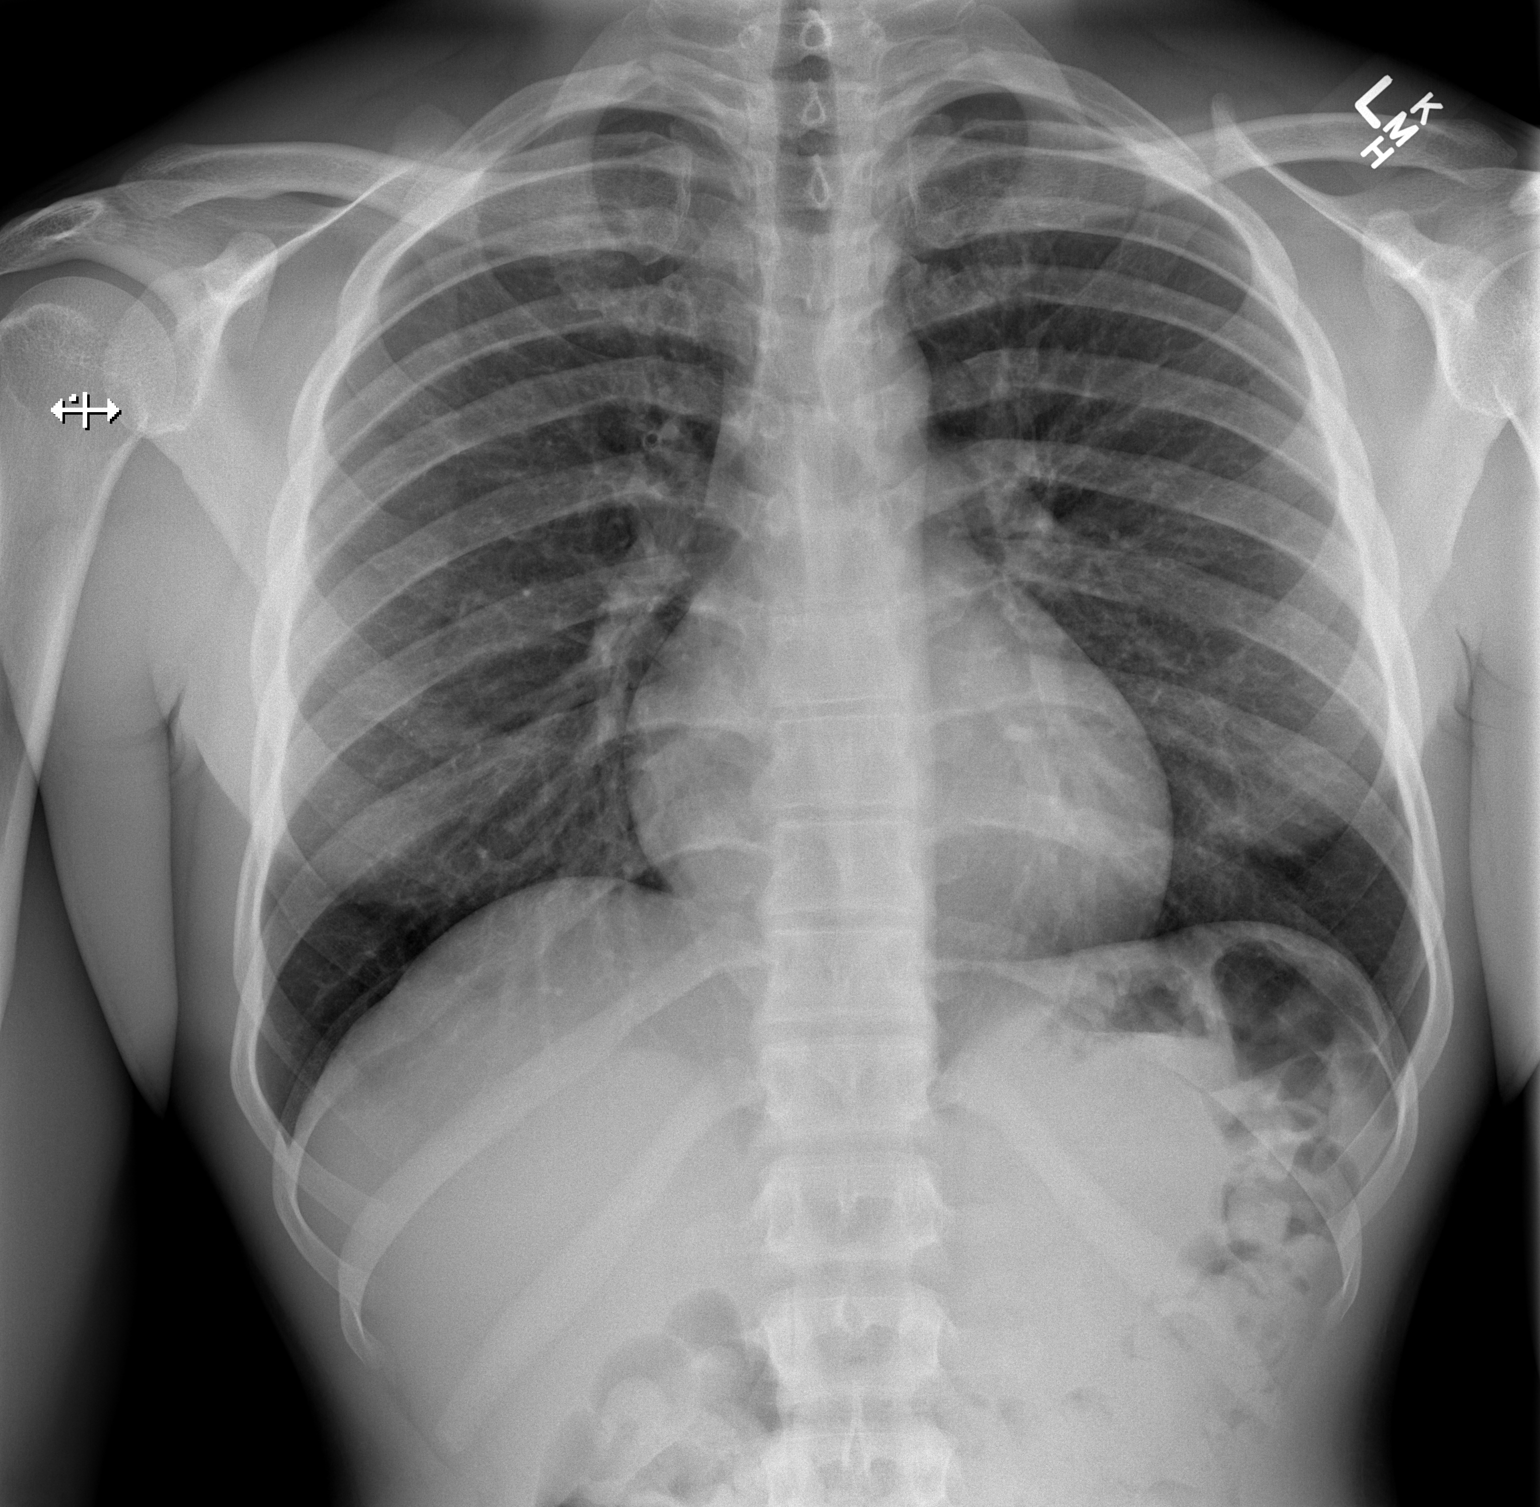

[w ribs ap upper left]
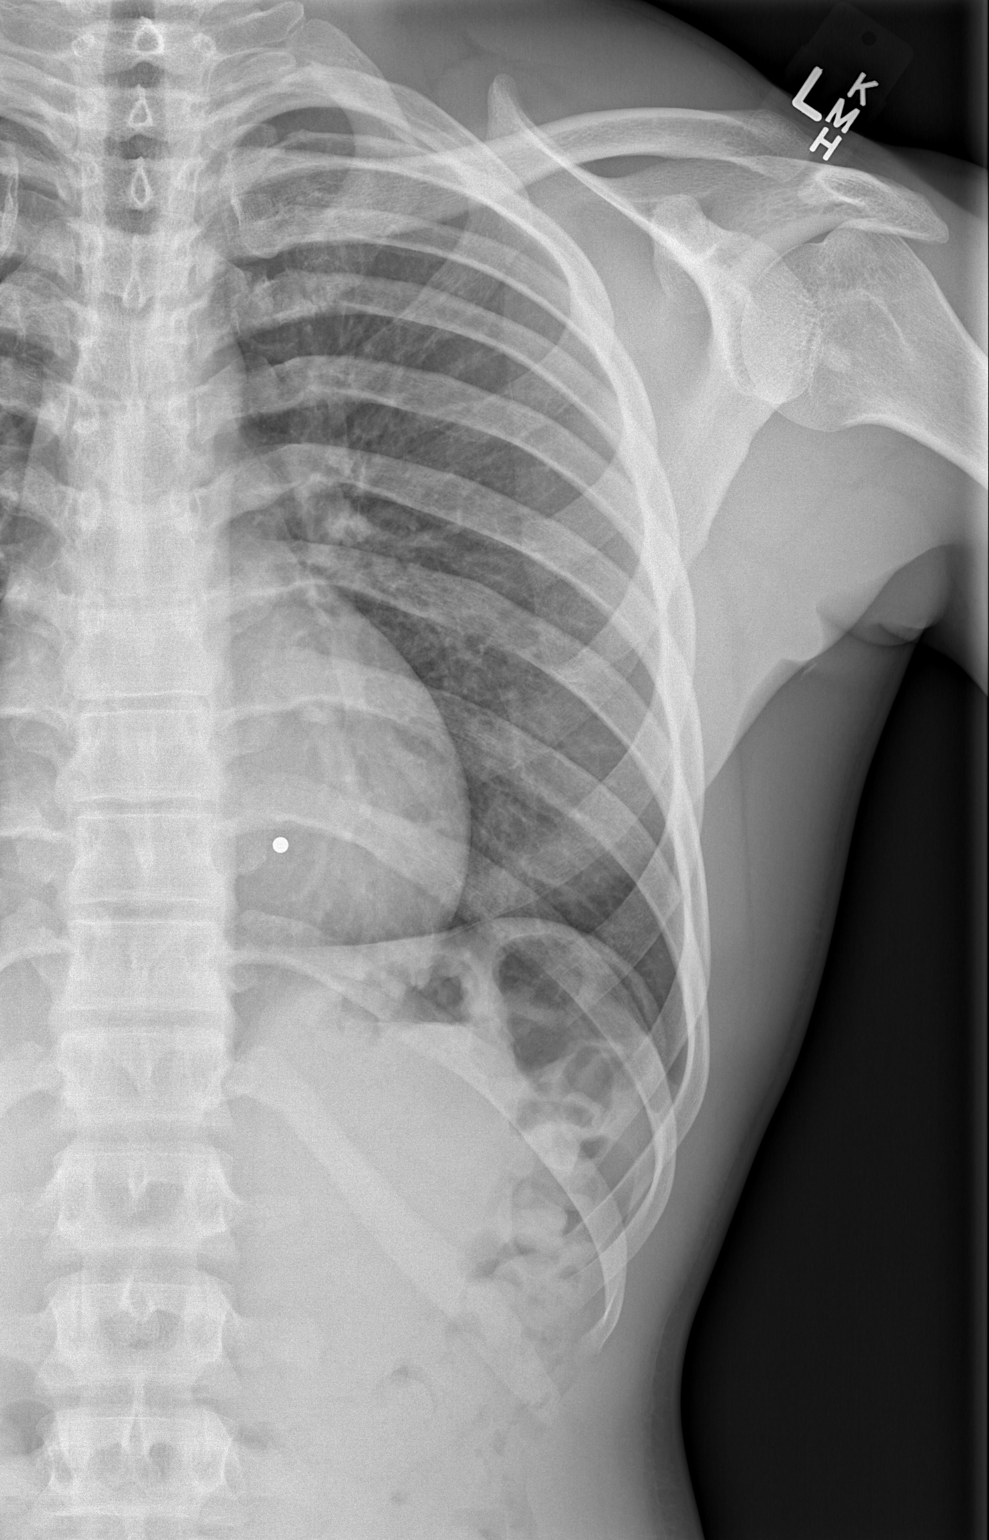

[w ribs obl left]
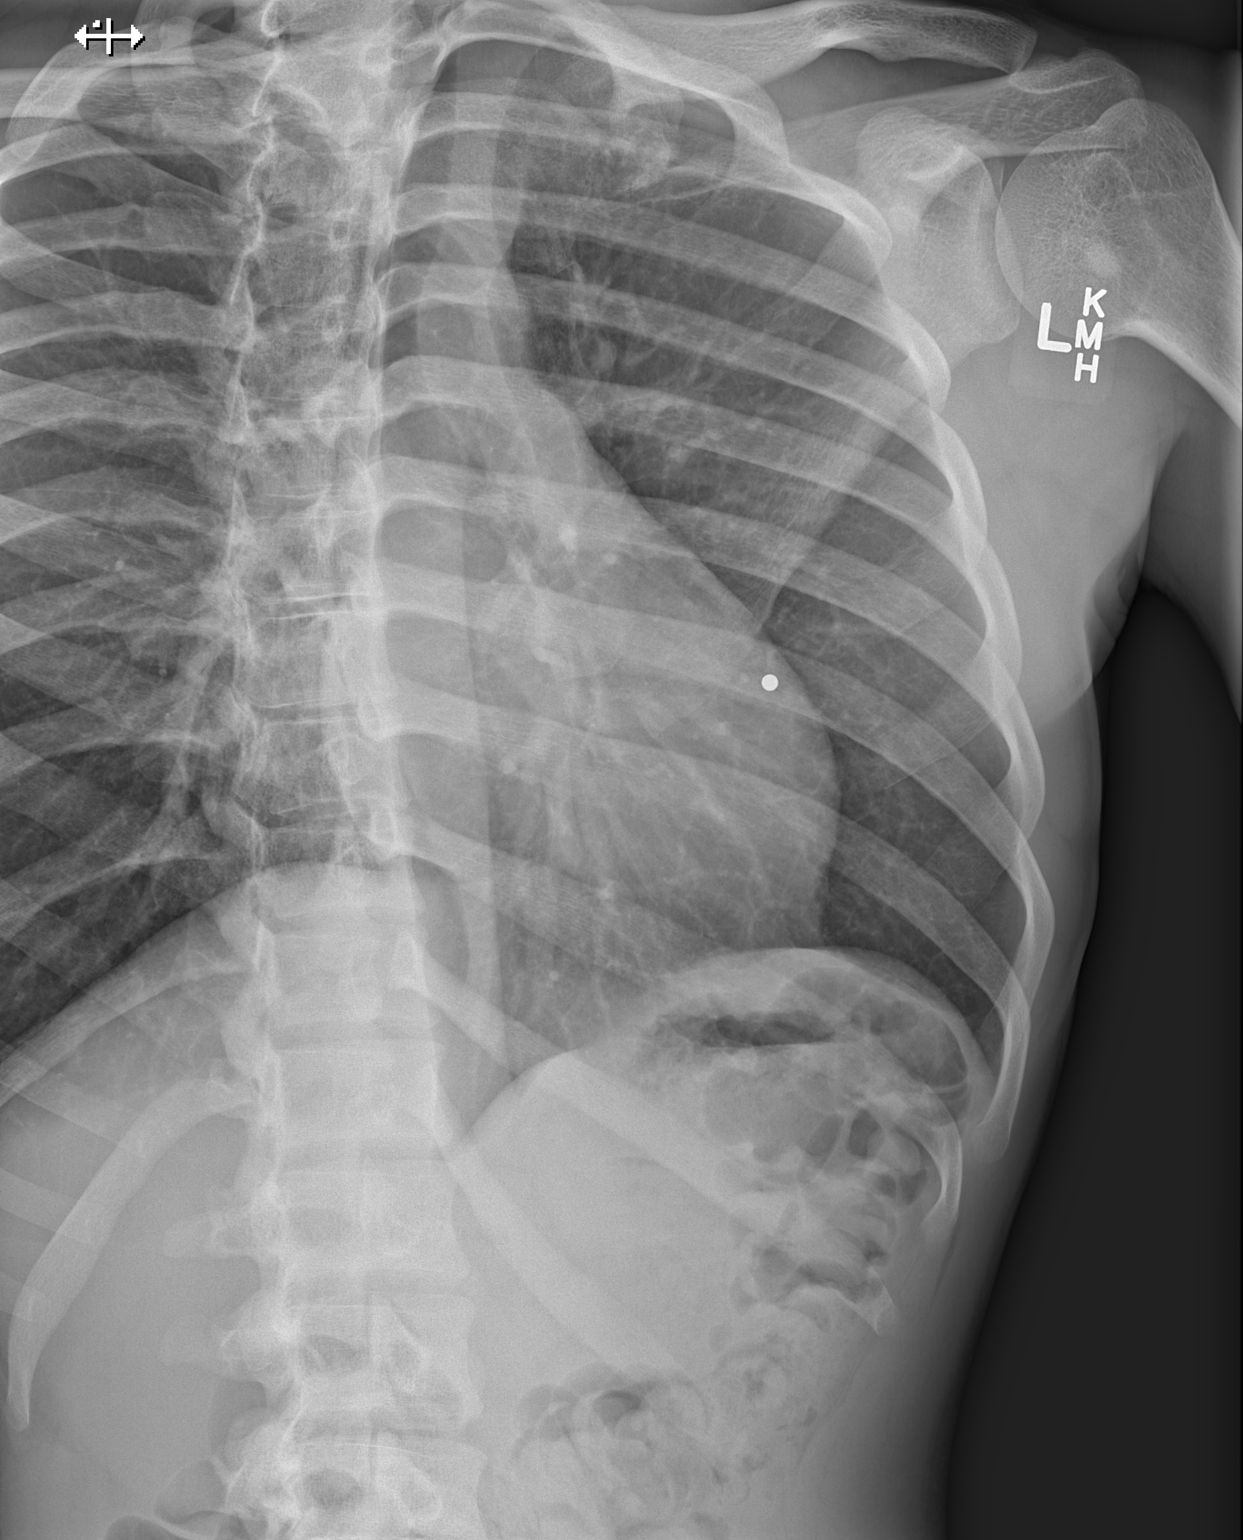

[3 of 3 positions shown; findings below may reference images not displayed]

FINDINGS: Normal heart size and pulmonary vascularity. No focal airspace
disease or consolidation in the lungs. No blunting of costophrenic
angles. No pneumothorax. Mediastinal contours appear intact.

The left ribs appear intact. No acute fracture or displacement
identified. No focal bone lesion, bone destruction, or expansile
change. No radiographic abnormalities identified to account for the
palpable lesion. Further management should be based on the physical
examination and clinical presentation.
IMPRESSION: No evidence of active pulmonary disease. Negative left ribs. No
radiographic abnormality identified to correspond to palpable
lesion.
